# Patient Record
Sex: Female | Born: 1945 | Hispanic: No | Marital: Married | Smoking: Never smoker
Health system: Southern US, Community
[De-identification: ages and names within clinical notes are randomized; demographics above are authoritative.]

## PROBLEM LIST (undated history)

## (undated) DIAGNOSIS — IMO0001 Reserved for inherently not codable concepts without codable children: Secondary | ICD-10-CM

## (undated) DIAGNOSIS — J45909 Unspecified asthma, uncomplicated: Secondary | ICD-10-CM

## (undated) DIAGNOSIS — K219 Gastro-esophageal reflux disease without esophagitis: Secondary | ICD-10-CM

## (undated) DIAGNOSIS — I10 Essential (primary) hypertension: Secondary | ICD-10-CM

## (undated) DIAGNOSIS — M1990 Unspecified osteoarthritis, unspecified site: Secondary | ICD-10-CM

## (undated) DIAGNOSIS — E119 Type 2 diabetes mellitus without complications: Secondary | ICD-10-CM

## (undated) HISTORY — PX: TONSILLECTOMY: SUR1361

---

## 2000-07-15 DIAGNOSIS — M1990 Unspecified osteoarthritis, unspecified site: Secondary | ICD-10-CM | POA: Insufficient documentation

## 2005-04-30 DIAGNOSIS — J309 Allergic rhinitis, unspecified: Secondary | ICD-10-CM | POA: Insufficient documentation

## 2006-05-08 DIAGNOSIS — I739 Peripheral vascular disease, unspecified: Secondary | ICD-10-CM

## 2006-09-24 ENCOUNTER — Ambulatory Visit (HOSPITAL_COMMUNITY): Admission: RE | Admit: 2006-09-24 | Discharge: 2006-09-24 | Payer: Self-pay | Admitting: *Deleted

## 2007-08-18 DIAGNOSIS — E7800 Pure hypercholesterolemia, unspecified: Secondary | ICD-10-CM | POA: Insufficient documentation

## 2007-08-18 DIAGNOSIS — K219 Gastro-esophageal reflux disease without esophagitis: Secondary | ICD-10-CM

## 2009-09-12 DIAGNOSIS — E785 Hyperlipidemia, unspecified: Secondary | ICD-10-CM

## 2009-09-12 DIAGNOSIS — G629 Polyneuropathy, unspecified: Secondary | ICD-10-CM

## 2010-10-26 DIAGNOSIS — M549 Dorsalgia, unspecified: Secondary | ICD-10-CM | POA: Insufficient documentation

## 2011-07-27 DIAGNOSIS — Z9119 Patient's noncompliance with other medical treatment and regimen: Secondary | ICD-10-CM | POA: Insufficient documentation

## 2011-10-31 DIAGNOSIS — R0902 Hypoxemia: Secondary | ICD-10-CM | POA: Insufficient documentation

## 2011-10-31 DIAGNOSIS — G4733 Obstructive sleep apnea (adult) (pediatric): Secondary | ICD-10-CM | POA: Insufficient documentation

## 2012-12-15 DIAGNOSIS — M5417 Radiculopathy, lumbosacral region: Secondary | ICD-10-CM | POA: Insufficient documentation

## 2013-11-09 DIAGNOSIS — E119 Type 2 diabetes mellitus without complications: Secondary | ICD-10-CM | POA: Insufficient documentation

## 2013-12-04 ENCOUNTER — Other Ambulatory Visit: Payer: Self-pay | Admitting: Specialist

## 2013-12-04 DIAGNOSIS — M545 Low back pain: Secondary | ICD-10-CM

## 2013-12-19 ENCOUNTER — Ambulatory Visit
Admission: RE | Admit: 2013-12-19 | Discharge: 2013-12-19 | Disposition: A | Discharge status: Home / Self Care | Payer: Medicare Other | Source: Ambulatory Visit | Attending: Specialist | Admitting: Specialist

## 2013-12-19 DIAGNOSIS — M545 Low back pain: Secondary | ICD-10-CM

## 2014-02-26 ENCOUNTER — Ambulatory Visit
Admission: RE | Admit: 2014-02-26 | Discharge: 2014-02-26 | Disposition: A | Discharge status: Home / Self Care | Payer: Medicare Other | Source: Ambulatory Visit | Attending: Specialist | Admitting: Specialist

## 2014-02-26 ENCOUNTER — Other Ambulatory Visit: Payer: Self-pay | Admitting: Specialist

## 2014-02-26 ENCOUNTER — Other Ambulatory Visit: Payer: Self-pay

## 2014-02-26 DIAGNOSIS — M545 Low back pain, unspecified: Secondary | ICD-10-CM

## 2014-02-26 DIAGNOSIS — M4316 Spondylolisthesis, lumbar region: Secondary | ICD-10-CM

## 2014-02-26 DIAGNOSIS — M5432 Sciatica, left side: Secondary | ICD-10-CM

## 2014-02-26 DIAGNOSIS — I10 Essential (primary) hypertension: Secondary | ICD-10-CM | POA: Insufficient documentation

## 2014-02-26 DIAGNOSIS — M79605 Pain in left leg: Principal | ICD-10-CM

## 2014-02-26 DIAGNOSIS — E669 Obesity, unspecified: Secondary | ICD-10-CM | POA: Insufficient documentation

## 2014-02-26 MED ORDER — IOHEXOL 180 MG/ML  SOLN
1.0000 mL | Freq: Once | INTRAMUSCULAR | Status: AC | PRN
  Administered 2014-02-26: 1 mL via INTRA_ARTICULAR

## 2014-02-26 MED ORDER — METHYLPREDNISOLONE ACETATE 40 MG/ML INJ SUSP (RADIOLOG
120.0000 mg | Freq: Once | INTRAMUSCULAR | Status: AC
  Administered 2014-02-26: 120 mg via INTRA_ARTICULAR

## 2014-02-26 NOTE — Discharge Instructions (Signed)

## 2014-03-02 ENCOUNTER — Other Ambulatory Visit: Payer: Self-pay | Admitting: Specialist

## 2014-03-02 DIAGNOSIS — M4316 Spondylolisthesis, lumbar region: Secondary | ICD-10-CM

## 2014-03-31 ENCOUNTER — Other Ambulatory Visit: Payer: Self-pay | Admitting: Specialist

## 2014-03-31 DIAGNOSIS — M545 Low back pain, unspecified: Secondary | ICD-10-CM

## 2014-04-16 ENCOUNTER — Other Ambulatory Visit: Payer: Self-pay | Admitting: Specialist

## 2014-04-16 ENCOUNTER — Ambulatory Visit
Admission: RE | Admit: 2014-04-16 | Discharge: 2014-04-16 | Disposition: A | Discharge status: Home / Self Care | Payer: Medicare Other | Source: Ambulatory Visit | Attending: Specialist | Admitting: Specialist

## 2014-04-16 DIAGNOSIS — M545 Low back pain, unspecified: Secondary | ICD-10-CM

## 2014-04-16 NOTE — Discharge Instructions (Signed)

## 2014-04-19 ENCOUNTER — Other Ambulatory Visit: Payer: Self-pay | Admitting: Specialist

## 2014-04-19 DIAGNOSIS — M545 Low back pain: Secondary | ICD-10-CM

## 2014-04-19 DIAGNOSIS — M5459 Other low back pain: Secondary | ICD-10-CM

## 2014-04-23 ENCOUNTER — Other Ambulatory Visit: Payer: Medicare Other

## 2014-04-29 ENCOUNTER — Other Ambulatory Visit: Payer: Self-pay | Admitting: Specialist

## 2014-04-29 ENCOUNTER — Ambulatory Visit
Admission: RE | Admit: 2014-04-29 | Discharge: 2014-04-29 | Disposition: A | Discharge status: Home / Self Care | Payer: Medicare Other | Source: Ambulatory Visit | Attending: Specialist | Admitting: Specialist

## 2014-04-29 VITALS — BP 186/91 | HR 75 | Temp 98.0°F | Resp 19 | Ht 64.0 in | Wt 255.0 lb

## 2014-04-29 DIAGNOSIS — M5417 Radiculopathy, lumbosacral region: Secondary | ICD-10-CM

## 2014-04-29 DIAGNOSIS — M5459 Other low back pain: Secondary | ICD-10-CM

## 2014-04-29 DIAGNOSIS — M545 Low back pain: Secondary | ICD-10-CM

## 2014-04-29 HISTORY — DX: Unspecified asthma, uncomplicated: J45.909

## 2014-04-29 HISTORY — DX: Essential (primary) hypertension: I10

## 2014-04-29 HISTORY — DX: Type 2 diabetes mellitus without complications: E11.9

## 2014-04-29 HISTORY — DX: Unspecified osteoarthritis, unspecified site: M19.90

## 2014-04-29 HISTORY — DX: Gastro-esophageal reflux disease without esophagitis: K21.9

## 2014-04-29 HISTORY — DX: Reserved for inherently not codable concepts without codable children: IMO0001

## 2014-04-29 MED ORDER — METHYLPREDNISOLONE ACETATE 40 MG/ML INJ SUSP (RADIOLOG
80.0000 mg | Freq: Once | INTRAMUSCULAR | Status: AC
  Administered 2014-04-29: 80 mg via INTRA_ARTICULAR

## 2014-04-29 MED ORDER — SODIUM CHLORIDE 0.9 % IV SOLN
Freq: Once | INTRAVENOUS | Status: AC
  Administered 2014-04-29: 10:00:00 via INTRAVENOUS

## 2014-04-29 MED ORDER — FENTANYL CITRATE 0.05 MG/ML IJ SOLN: 25.0000 ug | INTRAMUSCULAR | Status: DC | PRN

## 2014-04-29 MED ORDER — MIDAZOLAM HCL 2 MG/2ML IJ SOLN
1.0000 mg | INTRAMUSCULAR | Status: DC | PRN
  Administered 2014-04-29: 0.5 mg via INTRAVENOUS
  Administered 2014-04-29: 1 mg via INTRAVENOUS

## 2014-04-29 MED ORDER — KETOROLAC TROMETHAMINE 30 MG/ML IJ SOLN
30.0000 mg | Freq: Once | INTRAMUSCULAR | Status: AC
  Administered 2014-04-29: 30 mg via INTRAVENOUS

## 2014-04-29 NOTE — Discharge Instructions (Signed)
Radiofrequency Ablation Discharge Instructions  1. You may resume a regular diet and any medications that you routinely take (including pain medications). 2. No driving the day of procedure. 3. Upon discharge go home and rest for at least 4 hours.  You may use an ice pack as needed to injection sites on back. 4. Remove bandaids later today.    Please contact our office at [phone] for the [phone] symptoms:   Fever greater than 100 degrees  Increased swelling, pain, or redness at injection site.   Thank you for visiting [facility].

## 2014-07-29 ENCOUNTER — Ambulatory Visit (INDEPENDENT_AMBULATORY_CARE_PROVIDER_SITE_OTHER): Payer: Medicare Other | Admitting: Neurology

## 2014-07-29 ENCOUNTER — Ambulatory Visit (INDEPENDENT_AMBULATORY_CARE_PROVIDER_SITE_OTHER): Payer: Self-pay | Admitting: Neurology

## 2014-07-29 DIAGNOSIS — R202 Paresthesia of skin: Secondary | ICD-10-CM | POA: Diagnosis not present

## 2014-07-29 DIAGNOSIS — G609 Hereditary and idiopathic neuropathy, unspecified: Secondary | ICD-10-CM

## 2014-07-29 DIAGNOSIS — Z0289 Encounter for other administrative examinations: Secondary | ICD-10-CM

## 2014-07-29 NOTE — Progress Notes (Signed)
  [facility]    Provider:  Dr Lucia GaskinsAhern Referring Provider: Kerrin ChampagneNitka, James E, MD Primary Care Physician:  No primary care provider on file.  History:  Jacqueline Miles is a 69 y.o. female here as a referral from Dr. Otelia SergeantNitka for lumbar radiculopathy vs peripheral polyneuropathy. She has burning in the right thigh. She also has low back pain. She has numbness and tingling in the feet.No radicular pain.   Summary: Nerve conduction studies were performed on the bilateral lower extremities.   The right peroneal motor nerve showed reduced amplitude (0.5 mV, N>2)  The left peroneal motor nerve showed reduced amplitude (0.67 mV, N>2)  The right Tibial motor nerve showed reduced amplitude (0.2 mV, N>3)  The left Tibial motor nerve showed reduced amplitude (0.13 mV, N>3)  The right Peroneal F wave showed prolonged latency (66 ms, N<56). The left Peroneal F wave showed prolonged latency (66.2 ms, N<56). The bilateral Tibial F waves showed no response. Bilateral H Reflex studies showed no response Bilateral Sural sensory nerves showed no response Bilateral Peroneal sensory nerves showed no response  EMG needle study was performed on selected right-sided muscles: The Vastus Medialis, Anterior Tibialis, Medial Gastrocnemius, Abductor Hallucis, Biceps Femoris, Gluteus Maximus and Medius muscles and S1/L5/L4 paraspinal muscles were within normal limits.   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 No suggestion of lumbar radiculopathy.   Artemio Alyoni Ahern, MD  [facility]  [address]  [place], [place] [id number]  Phone [phone] Fax [phone]

## 2014-07-29 NOTE — Procedures (Signed)
[facility]    Provider:  Dr Lucia GaskinsAhern Referring Provider: Kerrin ChampagneNitka, James E, MD Primary Care Physician:  No primary care provider on file.  History:  Hale BogusMary Miles is a 69 y.o. female here as a referral from Dr. Otelia SergeantNitka for lumbar radiculopathy vs peripheral polyneuropathy. She has burning in the right thigh. She also has low back pain. She has numbness and tingling in the feet.No radicular pain.   Summary: Nerve conduction studies were performed on the bilateral lower extremities.   The right peroneal motor nerve showed reduced amplitude (0.5 mV, N>2)  The left peroneal motor nerve showed reduced amplitude (0.67 mV, N>2)  The right Tibial motor nerve showed reduced amplitude (0.2 mV, N>3)  The left Tibial motor nerve showed reduced amplitude (0.13 mV, N>3)  The right Peroneal F wave showed prolonged latency (66 ms, N<56). The left Peroneal F wave showed prolonged latency (66.2 ms, N<56). The bilateral Tibial F waves showed no response. Bilateral H Reflex studies showed no response Bilateral Sural sensory nerves showed no response Bilateral Peroneal sensory nerves showed no response  EMG needle study was performed on selected right-sided muscles: The Vastus Medialis, Anterior Tibialis, Medial Gastrocnemius, Abductor Hallucis, Biceps Femoris, Gluteus Maximus and Medius muscles and S1/L5/L4 paraspinal muscles were within normal limits.   Assessment/Plan: There is electrophysiologic evidence of a symmetric axonal sensorimotor polyneuropathy. No suggestion of lumbar radiculopathy.   Artemio Alyoni Stephon Weathers, MD  [facility]  [address]  [place], [place] [id number]  Phone [phone] Fax [phone]

## 2014-07-29 NOTE — Progress Notes (Signed)
See procedure note.

## 2015-02-10 ENCOUNTER — Emergency Department (HOSPITAL_COMMUNITY): Payer: Medicare Other

## 2015-02-10 ENCOUNTER — Encounter (HOSPITAL_COMMUNITY): Payer: Self-pay | Admitting: *Deleted

## 2015-02-10 ENCOUNTER — Emergency Department (HOSPITAL_COMMUNITY)
Admission: EM | Admit: 2015-02-10 | Discharge: 2015-02-10 | Disposition: A | Discharge status: Home / Self Care | Payer: Medicare Other | Attending: Emergency Medicine | Admitting: Emergency Medicine

## 2015-02-10 DIAGNOSIS — M1990 Unspecified osteoarthritis, unspecified site: Secondary | ICD-10-CM | POA: Diagnosis not present

## 2015-02-10 DIAGNOSIS — I10 Essential (primary) hypertension: Secondary | ICD-10-CM | POA: Diagnosis not present

## 2015-02-10 DIAGNOSIS — Z7951 Long term (current) use of inhaled steroids: Secondary | ICD-10-CM | POA: Insufficient documentation

## 2015-02-10 DIAGNOSIS — R0602 Shortness of breath: Secondary | ICD-10-CM

## 2015-02-10 DIAGNOSIS — J45901 Unspecified asthma with (acute) exacerbation: Secondary | ICD-10-CM | POA: Diagnosis not present

## 2015-02-10 DIAGNOSIS — Z7982 Long term (current) use of aspirin: Secondary | ICD-10-CM | POA: Diagnosis not present

## 2015-02-10 DIAGNOSIS — K219 Gastro-esophageal reflux disease without esophagitis: Secondary | ICD-10-CM | POA: Diagnosis not present

## 2015-02-10 DIAGNOSIS — Z79899 Other long term (current) drug therapy: Secondary | ICD-10-CM | POA: Insufficient documentation

## 2015-02-10 DIAGNOSIS — Z7984 Long term (current) use of oral hypoglycemic drugs: Secondary | ICD-10-CM | POA: Diagnosis not present

## 2015-02-10 DIAGNOSIS — R0682 Tachypnea, not elsewhere classified: Secondary | ICD-10-CM | POA: Diagnosis not present

## 2015-02-10 DIAGNOSIS — E119 Type 2 diabetes mellitus without complications: Secondary | ICD-10-CM | POA: Insufficient documentation

## 2015-02-10 DIAGNOSIS — R600 Localized edema: Secondary | ICD-10-CM | POA: Insufficient documentation

## 2015-02-10 LAB — CBC WITH DIFFERENTIAL/PLATELET
Basophils Absolute: 0 10*3/uL (ref 0.0–0.1)
Basophils Relative: 0 %
EOS ABS: 0.1 10*3/uL (ref 0.0–0.7)
Eosinophils Relative: 1 %
HCT: 36.3 % (ref 36.0–46.0)
Hemoglobin: 10.9 g/dL — ABNORMAL LOW (ref 12.0–15.0)
Lymphocytes Relative: 25 %
Lymphs Abs: 1.8 10*3/uL (ref 0.7–4.0)
MCH: 24.8 pg — AB (ref 26.0–34.0)
MCHC: 30 g/dL (ref 30.0–36.0)
MCV: 82.5 fL (ref 78.0–100.0)
MONOS PCT: 5 %
Monocytes Absolute: 0.4 10*3/uL (ref 0.1–1.0)
Neutro Abs: 4.8 10*3/uL (ref 1.7–7.7)
Neutrophils Relative %: 69 %
Platelets: 250 10*3/uL (ref 150–400)
RBC: 4.4 MIL/uL (ref 3.87–5.11)
RDW: 16.8 % — AB (ref 11.5–15.5)
WBC: 7 10*3/uL (ref 4.0–10.5)

## 2015-02-10 LAB — BASIC METABOLIC PANEL
Anion gap: 11 (ref 5–15)
BUN: 8 mg/dL (ref 6–20)
CO2: 27 mmol/L (ref 22–32)
CREATININE: 0.7 mg/dL (ref 0.44–1.00)
Calcium: 8.9 mg/dL (ref 8.9–10.3)
Chloride: 105 mmol/L (ref 101–111)
GFR calc non Af Amer: >60 mL/min (ref >60)
Glucose, Bld: 81 mg/dL (ref 65–99)
Potassium: 4 mmol/L (ref 3.5–5.1)
Sodium: 143 mmol/L (ref 135–145)

## 2015-02-10 LAB — I-STAT TROPONIN, ED: TROPONIN I, POC: 0 ng/mL (ref 0.00–0.08)

## 2015-02-10 LAB — BRAIN NATRIURETIC PEPTIDE: B Natriuretic Peptide: 31.7 pg/mL (ref 0.0–100.0)

## 2015-02-10 MED ORDER — ALBUTEROL SULFATE (2.5 MG/3ML) 0.083% IN NEBU
5.0000 mg | INHALATION_SOLUTION | Freq: Once | RESPIRATORY_TRACT | Status: AC
  Administered 2015-02-10: 5 mg via RESPIRATORY_TRACT

## 2015-02-10 MED ORDER — ALBUTEROL SULFATE (2.5 MG/3ML) 0.083% IN NEBU
INHALATION_SOLUTION | RESPIRATORY_TRACT | Status: AC
  Administered 2015-02-10: 5 mg via RESPIRATORY_TRACT
  Filled 2015-02-10: qty 6

## 2015-02-10 MED ORDER — PREDNISONE 50 MG PO TABS: 50.0000 mg | ORAL_TABLET | Freq: Every day | ORAL | Status: AC

## 2015-02-10 MED ORDER — ALBUTEROL SULFATE HFA 108 (90 BASE) MCG/ACT IN AERS: 2.0000 | INHALATION_SPRAY | RESPIRATORY_TRACT | Status: AC | PRN

## 2015-02-10 MED ORDER — ALBUTEROL SULFATE (2.5 MG/3ML) 0.083% IN NEBU
5.0000 mg | INHALATION_SOLUTION | Freq: Once | RESPIRATORY_TRACT | Status: AC
  Administered 2015-02-10: 5 mg via RESPIRATORY_TRACT
  Filled 2015-02-10: qty 6

## 2015-02-10 MED ORDER — ALBUTEROL SULFATE HFA 108 (90 BASE) MCG/ACT IN AERS
2.0000 | INHALATION_SPRAY | Freq: Once | RESPIRATORY_TRACT | Status: AC
  Administered 2015-02-10: 2 via RESPIRATORY_TRACT
  Filled 2015-02-10: qty 6.7

## 2015-02-10 MED ORDER — GUAIFENESIN ER 600 MG PO TB12
600.0000 mg | ORAL_TABLET | Freq: Two times a day (BID) | ORAL | Status: AC
Start: 2015-02-10 — End: ?

## 2015-02-10 MED ORDER — AZITHROMYCIN 250 MG PO TABS: 250.0000 mg | ORAL_TABLET | Freq: Every day | ORAL | Status: AC

## 2015-02-10 MED ORDER — METHYLPREDNISOLONE SODIUM SUCC 125 MG IJ SOLR
125.0000 mg | Freq: Once | INTRAMUSCULAR | Status: AC
  Administered 2015-02-10: 125 mg via INTRAVENOUS
  Filled 2015-02-10: qty 2

## 2015-02-10 MED ORDER — IPRATROPIUM-ALBUTEROL 0.5-2.5 (3) MG/3ML IN SOLN
3.0000 mL | Freq: Once | RESPIRATORY_TRACT | Status: AC
  Administered 2015-02-10: 3 mL via RESPIRATORY_TRACT
  Filled 2015-02-10: qty 3

## 2015-02-10 MED ORDER — AZITHROMYCIN 250 MG PO TABS
500.0000 mg | ORAL_TABLET | Freq: Once | ORAL | Status: AC
  Administered 2015-02-10: 500 mg via ORAL
  Filled 2015-02-10: qty 2

## 2015-02-10 NOTE — ED Provider Notes (Signed)
Medical screening examination/treatment/procedure(s) were conducted as a shared visit with non-physician practitioner(s) and myself.  I personally evaluated the patient during the encounter.   EKG Interpretation   Date/Time:  Thursday February 10 2015 12:17:50 EST Ventricular Rate:  78 PR Interval:  148 QRS Duration: 62 QT Interval:  384 QTC Calculation: 437 R Axis:   65 Text Interpretation:  Normal sinus rhythm Nonspecific T wave abnormality  now evident in diffuse leads Abnormal ECG No previous tracing Confirmed by  Hodan Wurtz MD, Reuel Boom ([id number]) on 02/10/2015 3:39:27 PM     70 year old female with 1 month of ongoing shortness of breath and cough. She does have some tight wheezing here that is improved with albuterol administration. She has some mild bilateral lower extremity pitting edema in her symptoms and difficult to differentiate between new onset mild CHF and bronchitis. No BNP elevation, improved with breathing treatments. Will treat with albuterol and steroids with coverage for atypical organisms. Plan to follow up with PCP as needed and return precautions discussed for worsening or new concerning symptoms.   See related encounter note   Lyndal Pulley, MD 02/11/15 [phone]

## 2015-02-10 NOTE — ED Notes (Signed)
MD at bedside. 

## 2015-02-10 NOTE — ED Notes (Signed)
After ambulating to restroom, PT returns to room tachypnic at a RR of 22 per minute. PT reports she feels winded.

## 2015-02-10 NOTE — ED Notes (Signed)
PT reports she feels confident in going home and will follow up with her PCP. Harolyn Rutherford, PA and attending approve discharge.

## 2015-02-10 NOTE — ED Notes (Signed)
After sitting up in bed and prior to ambulating, PT's O2 sats are 94%. PT ambulates down hall. PT's O2 sats drop to 92% and HR increases to 105. PT returns to bed. PT reports she feels out of breath. RR is 30 per minutes. Harolyn Rutherford, PA made aware

## 2015-02-10 NOTE — ED Provider Notes (Signed)
CSN: [id number]     Arrival date & time 02/10/15  1212 History   First MD Initiated Contact with Patient 02/10/15 1532     Chief Complaint  Patient presents with  . Shortness of Breath     (Consider location/radiation/quality/duration/timing/severity/associated sxs/prior Treatment) HPI   Jacqueline Miles is a 70 y.o. female, with a history of asthma, hypertension, anemia, and DM, presenting to the ED with shortness of breath that has been going on for the last two months. Pt states she was seeing her orthopedist for her back today and was told that she needed to go to the ED for her shortness of breath. Pt also complains of occasional chest tightness that comes on usually upon wakening in the morning. Pt denies any pain currently. Pt states that her shortness of breath "feels like a fullness that is trying to cut off my breath." Pt adds that she has had a cough and congestion for about a month and endorses bilateral ankle edema for the last few months. Pt states that her breathing has gotten significantly worse lately that it had been with her "cold." Pt denies fever/chills, N/V, syncope, or any other complaints.     Past Medical History  Diagnosis Date  . Asthma   . Shortness of breath dyspnea   . Hypertension   . Diabetes mellitus without complication (HCC)   . GERD (gastroesophageal reflux disease)   . Arthritis    Past Surgical History  Procedure Laterality Date  . Tonsillectomy    . Cesarean section  years ago with my baby   No family history on file. Social History  Substance Use Topics  . Smoking status: Never Smoker   . Smokeless tobacco: Not on file  . Alcohol Use: Not on file   OB History    No data available     Review of Systems  Constitutional: Negative for fever and chills.  Respiratory: Positive for cough and shortness of breath.   Cardiovascular: Negative for chest pain.  Gastrointestinal: Negative for nausea, vomiting and abdominal pain.  Neurological:  Negative for dizziness, syncope and light-headedness.  All other systems reviewed and are negative.     Allergies  Review of patient's allergies indicates no known allergies.  Home Medications   Prior to Admission medications   Medication Sig Start Date End Date Taking? Authorizing Provider  albuterol (PROVENTIL HFA;VENTOLIN HFA) 108 (90 BASE) MCG/ACT inhaler Inhale into the lungs. 08/10/13  Yes Historical Provider, MD  amLODipine (NORVASC) 2.5 MG tablet Take 2.5 mg by mouth daily.  10/30/13  Yes Historical Provider, MD  aspirin EC 81 MG tablet Take 81 mg by mouth daily.  07/27/11  Yes Historical Provider, MD  fluticasone (FLONASE) 50 MCG/ACT nasal spray Place 1 spray into the nose daily.  01/28/14  Yes Historical Provider, MD  furosemide (LASIX) 40 MG tablet Take 40 mg by mouth daily.  03/20/13  Yes Historical Provider, MD  gabapentin (NEURONTIN) 300 MG capsule TAKE ONE CAPSULE BY MOUTH THREE TIMES DAILY FOR LEG PAIN 04/20/13  Yes Historical Provider, MD  losartan (COZAAR) 100 MG tablet Take 100 mg by mouth daily.  08/10/13  Yes Historical Provider, MD  metFORMIN (GLUCOPHAGE-XR) 500 MG 24 hr tablet Take 500 mg by mouth daily with breakfast.  10/30/13  Yes Historical Provider, MD  omeprazole (PRILOSEC) 40 MG capsule Take 40 mg by mouth daily.  12/07/13  Yes Historical Provider, MD  oxybutynin (DITROPAN) 5 MG tablet Take 5 mg by mouth 2 (two) times daily.  12/16/13  Yes Historical Provider, MD  simvastatin (ZOCOR) 40 MG tablet Take 40 mg by mouth daily.  01/18/14  Yes Historical Provider, MD  tiZANidine (ZANAFLEX) 2 MG tablet Take 2 mg by mouth every 6 (six) hours as needed for muscle spasms.  03/27/13  Yes Historical Provider, MD  Vitamin D, Ergocalciferol, (DRISDOL) 50000 UNITS CAPS capsule Take 50,000 Units by mouth every 7 (seven) days.  01/31/12  Yes Historical Provider, MD  albuterol (PROVENTIL HFA;VENTOLIN HFA) 108 (90 Base) MCG/ACT inhaler Inhale 2 puffs into the lungs every 4 (four) hours as  needed for wheezing or shortness of breath. 02/10/15   Chalmer Zheng C Tyrene Nader, PA-C  azithromycin (ZITHROMAX Z-PAK) 250 MG tablet Take 1 tablet (250 mg total) by mouth daily. Begin taking 1 tablet a day tomorrow. 02/11/15   Arliene Rosenow C Kalyna Paolella, PA-C  guaiFENesin (MUCINEX) 600 MG 12 hr tablet Take 1 tablet (600 mg total) by mouth 2 (two) times daily. 02/10/15   Sadi Arave C Tyianna Menefee, PA-C  predniSONE (DELTASONE) 50 MG tablet Take 1 tablet (50 mg total) by mouth daily. 02/10/15   Karsen Fellows C Caitriona Sundquist, PA-C   BP 179/68 mmHg  Pulse 89  Temp(Src) 98.2 F (36.8 C) (Oral)  Resp 21  SpO2 96% Physical Exam  Constitutional: She appears well-developed and well-nourished. No distress.  HENT:  Head: Normocephalic and atraumatic.  Eyes: Conjunctivae are normal. Pupils are equal, round, and reactive to light.  Neck: Normal range of motion. Neck supple.  Cardiovascular: Normal rate, regular rhythm, normal heart sounds and intact distal pulses.   Pulmonary/Chest: Tachypnea noted. She has decreased breath sounds in the right upper field, the right middle field, the right lower field, the left upper field, the left middle field and the left lower field. She has wheezes in the right lower field and the left lower field.  Increased work of breathing with patient only able to speak short phrases without getting out of breath.  Abdominal: Soft. Bowel sounds are normal.  Musculoskeletal: She exhibits no tenderness.  Bilateral non-pitting foot and ankle edema extending to the mid-calf.  Lymphadenopathy:    She has no cervical adenopathy.  Neurological: She is alert.  Skin: Skin is warm and dry. She is not diaphoretic.  Nursing note and vitals reviewed.   ED Course  Procedures (including critical care time) Labs Review Labs Reviewed  CBC WITH DIFFERENTIAL/PLATELET - Abnormal; Notable for the following:    Hemoglobin 10.9 (*)    MCH 24.8 (*)    RDW 16.8 (*)    All other components within normal limits  BASIC METABOLIC PANEL  BRAIN NATRIURETIC  PEPTIDE  I-STAT TROPOININ, ED  Rosezena Sensor, ED    Imaging Review Dg Chest 2 View  02/10/2015  CLINICAL DATA:  Shortness of breath.  Fatigue.  Asthma. EXAM: CHEST  2 VIEW COMPARISON:  None. FINDINGS: Mild enlargement of the cardiopericardial silhouette with slight upper zone pulmonary vascular prominence but no Kerley B-lines or edema. Thoracic spondylosis. The lungs appear otherwise clear. No pleural effusion. IMPRESSION: 1. Mild cardiomegaly potentially with borderline pulmonary venous hypertension but no overt edema. 2. Thoracic spondylosis. Electronically Signed   By: Gaylyn Rong M.D.   On: 02/10/2015 12:55   I have personally reviewed and evaluated these images and lab results as part of my medical decision-making.   EKG Interpretation   Date/Time:  Thursday February 10 2015 12:17:50 EST Ventricular Rate:  78 PR Interval:  148 QRS Duration: 62 QT Interval:  384 QTC Calculation: 437 R Axis:  65 Text Interpretation:  Normal sinus rhythm Nonspecific T wave abnormality  now evident in diffuse leads Abnormal ECG No previous tracing Confirmed by  KNOTT MD, DANIEL [phone]) on 02/10/2015 3:39:55 PM      MDM   Final diagnoses:  Shortness of breath    Hale Bogus presents with shortness of breath that has been worsening for the last month.  Findings and plan of care discussed with Lyndal Pulley, MD.  Pt states that she feels that the albuterol helped her and improved her breathing. Do not suspect ACS. HEART score is 3, indicating low risk for a cardiac event. Wells criteria score is 0, indicating low risk for PE. Patient's breathing will be treated and her cardiac status assessed.  When the patient was reassessed after her second breathing treatment, her lung sounds have increased and her wheezing has vanished. Patient continues to maintain SPO2 of 100% on room air. Patient no longer exhibits increased work of breathing. Patient was advised that she would need to follow up  with her PCP sometime next week. Patient states that she already has an appointment for next week. Pt will be treated for bronchitis. Patient had no significant drop in her SPO2 or rise in her pulse while ambulating. The patient was given instructions for home care as well as return precautions. Patient voices understanding of these instructions, accepts the plan, and is comfortable with discharge.   Filed Vitals:   02/10/15 1645 02/10/15 1800 02/10/15 1815 02/10/15 1830  BP: 168/78   179/68  Pulse: 71 81 82 89  Temp:      TempSrc:      Resp: SpO2: 100% 96% 98% 96%     Anselm Pancoast, PA-C 02/10/15 1848  Lyndal Pulley, MD 02/11/15 [phone]

## 2015-02-10 NOTE — ED Notes (Addendum)
Pt reports SOB that has been worsening for 1 month. Pt states that it is worse with exertion. Pt denies pain at this time. Pt reports recent cough and congestion as well.

## 2015-02-10 NOTE — Discharge Instructions (Signed)
You have been seen today for shortness of breath, cough, and congestion. Your imaging and lab tests showed no abnormalities. Follow up with PCP within the next week. Return to ED should symptoms worsen.   Emergency Department Resource Guide 1) Find a Doctor and Pay Out of Pocket Although you won't have to find out who is covered by your insurance plan, it is a good idea to ask around and get recommendations. You will then need to call the office and see if the doctor you have chosen will accept you as a new patient and what types of options they offer for patients who are self-pay. Some doctors offer discounts or will set up payment plans for their patients who do not have insurance, but you will need to ask so you aren't surprised when you get to your appointment.  2) Contact Your Local Health Department Not all health departments have doctors that can see patients for sick visits, but many do, so it is worth a call to see if yours does. If you don't know where your local health department is, you can check in your phone book. The CDC also has a tool to help you locate your state's health department, and many state websites also have listings of all of their local health departments.  3) Find a Walk-in Clinic If your illness is not likely to be very severe or complicated, you may want to try a walk in clinic. These are popping up all over the country in pharmacies, drugstores, and shopping centers. They're usually staffed by nurse practitioners or physician assistants that have been trained to treat common illnesses and complaints. They're usually fairly quick and inexpensive. However, if you have serious medical issues or chronic medical problems, these are probably not your best option.  No Primary Care Doctor: - Call Health Connect at  [phone] - they can help you locate a primary care doctor that  accepts your insurance, provides certain services, etc. - Physician Referral Service-  [phone]  Chronic Pain Problems: Organization         Address  Phone   Notes  [facility]  [phone] Patients need to be referred by their primary care doctor.   Medication Assistance: Organization         Address  Phone   Notes  [facility] Medication [facility] [address] [place] [place], [place] [address] [phone] --Must be a resident of [facility] -- Must have NO insurance coverage whatsoever (no Medicaid/ Medicare, etc.) -- The pt. MUST have a primary care doctor that directs their care regularly and follows them in the community   [facility]  [phone]   [employer]  [phone]    Agencies that provide inexpensive medical care: Organization         Address  Phone   Notes  [facility]  [phone]   [facility]    [phone]   [facility] [address] [place], [place] [address] [phone]   [facility] [address] [place] [place] [phone]   Planned Parenthood    [phone]   [facility]    [phone]   [facility]  [address],  Phone:  [phone], Fax:  [phone] Hours of Operation:  9 am - 6 pm, M-F.  Also accepts Medicaid/Medicare and self-pay.  [facility]  [address], [place] Phone: [phone], Fax: [phone]. Hours of Operation:  8:30 am - 5:30 pm, M-F.  Also accepts Medicaid and self-pay.  [facility] [place] [address] [place] Phone: [phone]   [place], [place], [place] [phone], Ext. [phone] Mondays & Thursdays: 7-9 AM.  First 15 patients are seen on a first come, first serve basis.    [place] Providers:  Organization         Address  Phone   Notes  [facility] [address],  [place] [phone] Also accepts self-pay patients.  [facility] [id number] G. L. Garcia, [place]  [phone]   [place], [address], [place] [phone]   [facility] [address] [place] [phone]   Lucianne Lei [address], [place]   [phone] Only accepts [place] Access [place] patients after they have their name applied to their card.   Self-Pay (no insurance) in [facility]:  Organization         Address  Phone   Notes  Sickle Cell Patients, [facility] Internal Medicine [place] [phone]   [facility] Urgent Care [place] [phone]   [facility] [place]  [place], [place], [place] [phone]   [facility]/Dr. Osei-Bonsu  [address] [place] or [address], [place] [phone] Phone number for both [place] and [place] locations is the same.  [facility] [address] [place] [phone]   [facility] [address] [place] or [address] [place] [address] [phone]   [facility] [address] [place] [phone], phone; [phone], fax Sees patients 1st and 3rd Saturday of every month.  Must not qualify for public or private insurance (i.e. Medicaid, Medicare, [place] Health Choice, Veterans' Benefits)  Household income should be no more than 200% of the poverty level The clinic cannot treat you if you are pregnant or think you are pregnant  Sexually transmitted diseases are not treated at the clinic.    Dental Care: Organization         Address  Phone  Notes  [facility] Department of [facility] [phone] Accepts children up to age 64 who are enrolled in  [place] or [place]; pregnant women with a Medicaid card; and children who have applied for Medicaid or [place] Health Choice, but were declined, whose parents can pay a reduced fee at time of service.  [facility] Department of [facility]  [address], [place] [phone] Accepts children up to age 59 who are enrolled in [place] or [place]; pregnant women with a Medicaid card; and children who have applied for Medicaid or [place] Health Choice, but were declined, whose parents can pay a reduced fee at time of service.  [facility]  [place] [phone] Patients are seen by appointment only. Walk-ins are not accepted. [place] will see patients 70 years of age and older. Monday - Tuesday (8am-5pm) Most Wednesdays (8:30-5pm) $30 per visit, cash only  [facility]  [address], [facility] [phone] Patients are seen by appointment only. Walk-ins are not accepted. [place] will see patients 55 years of age and older. One Wednesday Evening (Monthly: Volunteer Based).  $30 per visit, cash only  [place]  [phone] for adults; Children under age 16, call Graduate Pediatric Dentistry at [phone]. Children aged 15-14, please call [phone] to request a pediatric application.  Dental services are provided in all areas of dental care including fillings, crowns and bridges, complete and partial dentures, implants, gum treatment, root canals, and extractions. Preventive care is also provided. Treatment is provided to both adults and children. Patients are selected via a lottery and there is often a waiting list.   [facility] [address] [place]  [phone] [email or web address]   [facility] [address] [place], [place] [phone], Ext. [phone] Second and Fourth Thursday of each month, opens at 6:30  AM; Clinic ends at 9 AM.  Patients are seen on a first-come first-served basis, and a limited number are seen during each clinic.   [facility]  [address] Hillard Danker [place], [place] [phone]   Eligibility Requirements You must have lived in [place], [place], or [place] counties for at least the last three months.   You cannot be eligible for state or federal sponsored [employer], including [employer], [place], or [employer].   You generally cannot be eligible for healthcare insurance through your employer.    How to apply: Eligibility screenings are held every Tuesday and Wednesday afternoon from 1:00 pm until 4:00 pm. You do not need an appointment for the interview!  [facility] [address] [place], [place]   [employer]  [place] Department  [place]  [phone]    Behavioral Health Resources in the Community: Intensive Outpatient Programs Organization         Address  Phone  Notes  [place]. [address], [place], [place] [phone]   [facility]patient [address], [place], [employer]   ADS: [facility] Drug [facility] [address], [place], [place]   [place] [address]  [place], [place] or [phone]   Substance Abuse Resources Organization         Address  Phone  Notes  [facility] Drug Services  [phone]   [place]  [phone]   [facility] [place]   Chinita Pester  [phone]   Residential & Outpatient Substance Abuse Program  [phone]   Psychological Services Organization         Address  Phone  Notes  [facility] [place]  [place]  [phone]   [place] [address] [place] [phone] or  [phone]    Mobile Crisis Teams Organization         Address  Phone  Notes  [facility], Mobile Crisis Care Unit  [phone]   [facility]  [address] [place], [place]   [place] [address] [place] [phone]    Self-Help/Support Groups Organization         Address  Phone             Notes  [facility] [place] - variety of support groups  [place] Call for more information  Narcotics Anonymous (NA), [facility] [address], [employer] [place]  2 meetings at this location   Special educational needs teacher         Address  Phone  Notes  [facility] [place],    [place]  [phone]   [facility]  [address], [place] [address], [place], [place]   [place], [place] [phone] Admissions: 8am-3pm M-F  Incentives Substance [place] [address],    [place], [place] [phone]   [facility] [address] [place], [place], [place]   [facility] [address],  [place], [place]   [facility] - Intensive Outpatient [address] Krist[address], [place], [place]   [facility] ([place].) [place].,  [place], [place] [phone] or [phone]   [facility] ([facility]) [address] [place], [place] Accepts Medicaid  [facility] [place] [address]  [place] [phone] Substance Abuse/Addiction Treatment   [facility] Organization         Address  Phone  Notes  [facility]  [phone]   Domenic Schwab, PhD [address] Arlis Porta [place], [place]   [phone] or [phone]   [place] Percy [place], [place] [phone]   [facility] [address],  [place], [place] [phone] Insurance/Medicaid/sponsorship through [facility] and Families [address]                                    [place], [place] [phone] [place] [address], [place] [phone]    Dr. Adele Schilder  [phone]   [facility] Dept. 1) [address] [place] 2) [place] 3)  [place] [address], [place] [phone]  [phone]   [place] [phone] or [phone] (After Hours)

## 2016-07-16 IMAGING — DX DG CHEST 2V
2 series · 2 of 2 positions shown · non-contrast
Comparison: None.

CLINICAL DATA: Shortness of breath.  Fatigue.  Asthma.

EXAM:
CHEST  2 VIEW

[chest pa]
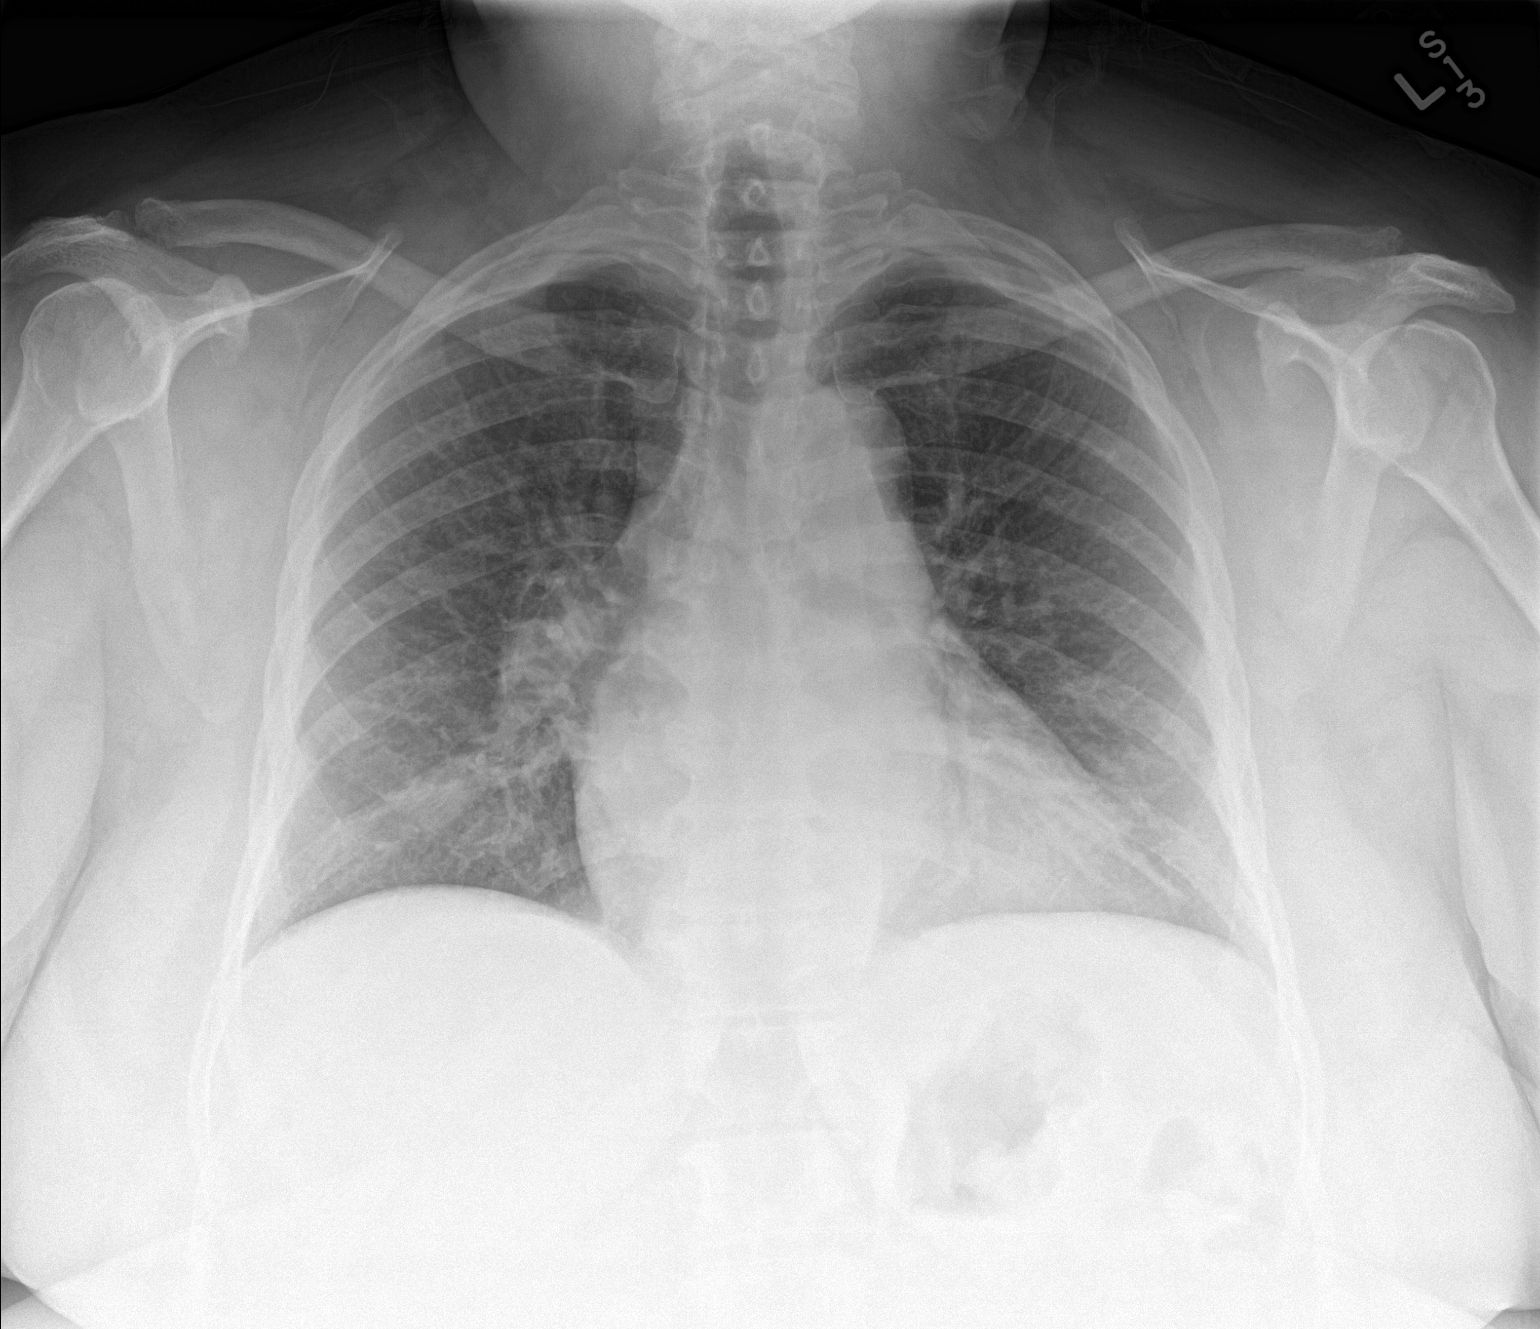

[chest lat]
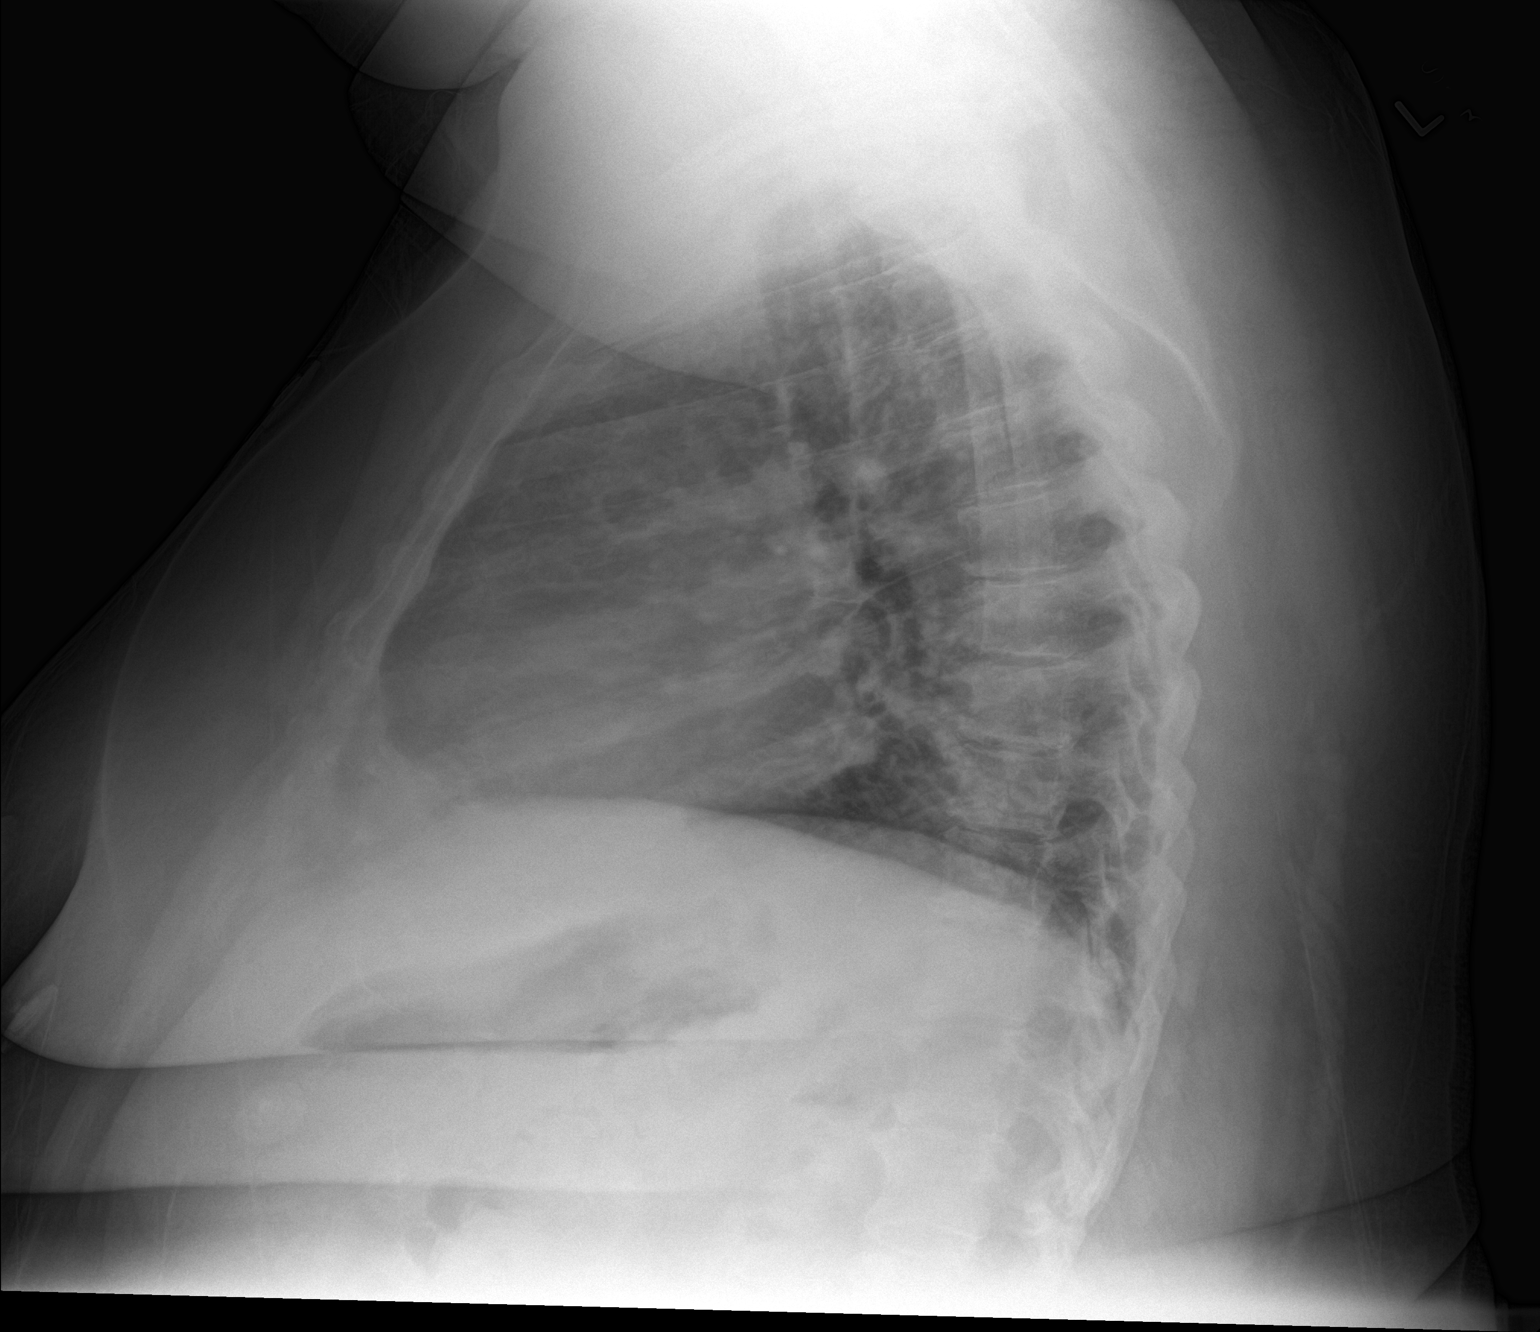

[2 of 2 positions shown; findings below may reference images not displayed]

FINDINGS: Mild enlargement of the cardiopericardial silhouette with slight
upper zone pulmonary vascular prominence but no Kerley B-lines or
edema.

Thoracic spondylosis. The lungs appear otherwise clear. No pleural
effusion.
IMPRESSION: 1. Mild cardiomegaly potentially with borderline pulmonary venous
hypertension but no overt edema.
2. Thoracic spondylosis.
